# Patient Record
Sex: Female | Born: 1960 | Race: White | Hispanic: No | Marital: Single | Smoking: Former smoker
Health system: Southern US, Community
[De-identification: ages and names within clinical notes are randomized; demographics above are authoritative.]

## PROBLEM LIST (undated history)

## (undated) DIAGNOSIS — Z789 Other specified health status: Secondary | ICD-10-CM

## (undated) HISTORY — PX: CARDIAC CATHETERIZATION: SHX172

## (undated) HISTORY — PX: APPENDECTOMY: SHX54

## (undated) HISTORY — PX: LEFT OOPHORECTOMY: SHX1961

## (undated) HISTORY — PX: MOUTH SURGERY: SHX715

---

## 2011-07-30 ENCOUNTER — Other Ambulatory Visit: Payer: Self-pay | Admitting: Obstetrics and Gynecology

## 2011-07-30 DIAGNOSIS — Z139 Encounter for screening, unspecified: Secondary | ICD-10-CM

## 2011-08-09 ENCOUNTER — Ambulatory Visit (HOSPITAL_COMMUNITY)
Admission: RE | Admit: 2011-08-09 | Discharge: 2011-08-09 | Disposition: A | Discharge status: Home / Self Care | Payer: 59 | Source: Ambulatory Visit | Attending: Obstetrics and Gynecology | Admitting: Obstetrics and Gynecology

## 2011-08-09 ENCOUNTER — Other Ambulatory Visit (HOSPITAL_COMMUNITY)
Admission: RE | Admit: 2011-08-09 | Discharge: 2011-08-09 | Disposition: A | Discharge status: Home / Self Care | Payer: 59 | Source: Ambulatory Visit | Attending: Obstetrics & Gynecology | Admitting: Obstetrics & Gynecology

## 2011-08-09 ENCOUNTER — Other Ambulatory Visit: Payer: Self-pay | Admitting: Obstetrics & Gynecology

## 2011-08-09 DIAGNOSIS — Z139 Encounter for screening, unspecified: Secondary | ICD-10-CM

## 2011-08-09 DIAGNOSIS — Z1231 Encounter for screening mammogram for malignant neoplasm of breast: Secondary | ICD-10-CM | POA: Insufficient documentation

## 2011-08-09 DIAGNOSIS — Z01419 Encounter for gynecological examination (general) (routine) without abnormal findings: Secondary | ICD-10-CM | POA: Insufficient documentation

## 2011-09-20 ENCOUNTER — Telehealth: Payer: Self-pay

## 2011-09-20 ENCOUNTER — Other Ambulatory Visit: Payer: Self-pay

## 2011-09-20 DIAGNOSIS — Z139 Encounter for screening, unspecified: Secondary | ICD-10-CM

## 2011-09-20 NOTE — Telephone Encounter (Signed)
PREPOPIK-DRINK WATER TO KEEP URINE LIGHT YELLOW.  PT SHOULD DROP OFF RX 3 DAYS PRIOR TO PROCEDURE.  

## 2011-09-20 NOTE — Telephone Encounter (Signed)
Gastroenterology Pre-Procedure Form   Pt was triaged by Ginger  Request Date: 09/16/2011      Requesting Physician: Dr. Turner Daniels     PATIENT INFORMATION:  Alison Jordan is a 51 y.o., female (DOB=[birth year]).  PROCEDURE: Procedure(s) requested: colonoscopy Procedure Reason: screening for colon cancer  PATIENT REVIEW QUESTIONS: The patient reports the following:   1. Diabetes Melitis: no 2. Joint replacements in the past 12 months: no 3. Major health problems in the past 3 months: no 4. Has an artificial valve or MVP:no 5. Has been advised in past to take antibiotics in advance of a procedure like teeth cleaning: no}    MEDICATIONS & ALLERGIES:    Patient reports the following regarding taking any blood thinners:   Plavix? no Aspirin?no Coumadin?  no  Patient confirms/reports the following medications:  Current Outpatient Prescriptions  Medication Sig Dispense Refill  . Multiple Vitamin (MULTIVITAMIN) tablet Take 1 tablet by mouth daily.        Patient confirms/reports the following allergies:  No Known Allergies  Patient is appropriate to schedule for requested procedure(s): yes  AUTHORIZATION INFORMATION Primary Insurance:   ID #:  Group #:  Pre-Cert / Auth required: Pre-Cert / Auth #:   Secondary Insurance:   ID #:   Group #: Pre-Cert / Auth required Pre-Cert / Auth #:   No orders of the defined types were placed in this encounter.    SCHEDULE INFORMATION: Procedure has been scheduled as follows:  Date: 10/21/2011     Time: 8:30 AM  Location: [facility] Short Stay  This Gastroenterology Pre-Precedure Form is being routed to the following provider(s) for review: Jonette Eva, MD

## 2011-09-23 MED ORDER — SOD PICOSULFATE-MAG OX-CIT ACD 10-3.5-12 MG-GM-GM PO PACK: 1.0000 | PACK | Freq: Once | ORAL | Status: DC

## 2011-09-23 NOTE — Telephone Encounter (Signed)
Pt is not able to do on 10/21/2011. She is rescheduled for 10/11/2011 @ 2:30 PM. LMOM for Kim.   Rx and instructions mailed to pt.

## 2011-09-27 ENCOUNTER — Encounter (HOSPITAL_COMMUNITY): Payer: Self-pay | Admitting: Pharmacy Technician

## 2011-10-07 ENCOUNTER — Telehealth: Payer: Self-pay

## 2011-10-07 NOTE — Telephone Encounter (Signed)
Pt called to confirm her appt for 10/11/2011 for colonoscopy. Appt at 2:30 and she will need to arrive at [facility] @ 1:30 PM.

## 2011-10-11 ENCOUNTER — Encounter (HOSPITAL_COMMUNITY): Payer: Self-pay | Admitting: *Deleted

## 2011-10-11 ENCOUNTER — Ambulatory Visit (HOSPITAL_COMMUNITY)
Admission: RE | Admit: 2011-10-11 | Discharge: 2011-10-11 | Disposition: A | Discharge status: Home / Self Care | Payer: 59 | Source: Ambulatory Visit | Attending: Gastroenterology | Admitting: Gastroenterology

## 2011-10-11 ENCOUNTER — Encounter (HOSPITAL_COMMUNITY)
Admission: RE | Disposition: A | Discharge status: Home / Self Care | Payer: Self-pay | Source: Ambulatory Visit | Attending: Gastroenterology

## 2011-10-11 DIAGNOSIS — K633 Ulcer of intestine: Secondary | ICD-10-CM

## 2011-10-11 DIAGNOSIS — Z1211 Encounter for screening for malignant neoplasm of colon: Secondary | ICD-10-CM

## 2011-10-11 DIAGNOSIS — D129 Benign neoplasm of anus and anal canal: Secondary | ICD-10-CM | POA: Insufficient documentation

## 2011-10-11 DIAGNOSIS — K621 Rectal polyp: Secondary | ICD-10-CM

## 2011-10-11 DIAGNOSIS — Z139 Encounter for screening, unspecified: Secondary | ICD-10-CM

## 2011-10-11 DIAGNOSIS — K648 Other hemorrhoids: Secondary | ICD-10-CM | POA: Insufficient documentation

## 2011-10-11 DIAGNOSIS — D126 Benign neoplasm of colon, unspecified: Secondary | ICD-10-CM

## 2011-10-11 DIAGNOSIS — K620 Anal polyp: Secondary | ICD-10-CM

## 2011-10-11 DIAGNOSIS — K6389 Other specified diseases of intestine: Secondary | ICD-10-CM | POA: Insufficient documentation

## 2011-10-11 DIAGNOSIS — D128 Benign neoplasm of rectum: Secondary | ICD-10-CM | POA: Insufficient documentation

## 2011-10-11 HISTORY — DX: Other specified health status: Z78.9

## 2011-10-11 HISTORY — PX: COLONOSCOPY: SHX5424

## 2011-10-11 SURGERY — COLONOSCOPY
Anesthesia: Moderate Sedation

## 2011-10-11 MED ORDER — MIDAZOLAM HCL 5 MG/5ML IJ SOLN
INTRAMUSCULAR | Status: DC | PRN
  Administered 2011-10-11 (×2): 2 mg via INTRAVENOUS

## 2011-10-11 MED ORDER — MIDAZOLAM HCL 5 MG/5ML IJ SOLN
INTRAMUSCULAR | Status: AC
  Filled 2011-10-11: qty 10

## 2011-10-11 MED ORDER — MEPERIDINE HCL 100 MG/ML IJ SOLN
INTRAMUSCULAR | Status: DC | PRN
  Administered 2011-10-11: 50 mg
  Administered 2011-10-11: 25 mg

## 2011-10-11 MED ORDER — SODIUM CHLORIDE 0.45 % IV SOLN
Freq: Once | INTRAVENOUS | Status: AC
  Administered 2011-10-11: 20 mL/h via INTRAVENOUS

## 2011-10-11 MED ORDER — MEPERIDINE HCL 100 MG/ML IJ SOLN
INTRAMUSCULAR | Status: AC
  Filled 2011-10-11: qty 1
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
## 2011-10-11 NOTE — Op Note (Signed)
[facility] [address] [place], [place]  [address]  COLONOSCOPY PROCEDURE REPORT  PATIENT:  Alison Jordan, Alison Jordan  MR#:  [id number] BIRTHDATE:  03-21-1960, 51 yrs. old  GENDER:  female  ENDOSCOPIST:  Jonette Eva, MD REF. BY:  Duane Lope, M.D. ASSISTANT:  PROCEDURE DATE:  10/11/2011 PROCEDURE:  IEOColonoscopy with biopsy  INDICATIONS:  Screening  MEDICATIONS:   Demerol 75 mg IV, Versed 4 mg IV  DESCRIPTION OF PROCEDURE:    Physical exam was performed. Informed consent was obtained from the patient after explaining the benefits, risks, and alternatives to procedure.  The patient was connected to monitor and placed in left lateral position. Continuous oxygen was provided by nasal cannula and IV medicine administered through an indwelling cannula.  After administration of sedation and rectal exam, the patient's rectum was intubated and the EC-[id number] ([id number]) colonoscope was advanced under direct visualization to the ILEUM.  The scope was removed slowly by carefully examining the color, texture, anatomy, and integrity mucosa on the way out.  The patient was recovered in endoscopy and discharged home in satisfactory condition. <<PROCEDUREIMAGES>>  FINDINGS:  ONE 1 MM ulcer was found in the ileum & BIOPSIED VIA COLD FORCEPS.  There wAS ONE 4 MM SESSILE polyp identified and removed. in the proximal transverse colon.  There were FOUR 2-3 MM HYPERPLASTIC APPEARING SESSILE polyps identified and removed. in the sigmoid colon.  There were TWO 2-3 MM  HYPERPLASTIC APPEARING SESSILE polyps identified and removed. in the rectum.  ALL POLYPS REMOVED VIA COLD FORCEPS. MELANOSIS COLI. SMALL Internal Hemorrhoids were found.  PREP QUALITY:    GOOD CECAL W/D TIME:      23.5 minutes  COMPLICATIONS:    None  ENDOSCOPIC IMPRESSION: 1) Ulcer in the ileum 2) Polyp in the proximal transverse colon 3) Polyps, multiple in the sigmoid colon 4) Polyps, multiple in the rectum 5) Internal  hemorrhoids 6) MELANOSIS COLI  RECOMMENDATIONS: AWAIT BIOPSY HIGH FIBER DIET TCS IN 10 YEARS  REPEAT EXAM:  No  ______________________________ Jonette Eva, MD  CC:  Duane Lope, M.D.  n. eSIGNED:   Raydell Maners at 10/11/2011 03:31 PM  [place], [place], [phone]

## 2011-10-17 ENCOUNTER — Encounter (HOSPITAL_COMMUNITY): Payer: Self-pay | Admitting: Gastroenterology

## 2011-10-21 ENCOUNTER — Telehealth: Payer: Self-pay | Admitting: Gastroenterology

## 2011-10-21 NOTE — Telephone Encounter (Signed)
Please call pt. SHE had simple & SERRATED adenomaSremoved from hER colon. SHE HAD A FEW SMALL ULCERS IN HER SMALL BOWEL THAT COME FROM USING ASA OR NSAIDS.  FOLLOW A High fiber diet. TCS IN 10 YEARS.

## 2011-10-22 NOTE — Telephone Encounter (Signed)
Recall made 

## 2011-10-22 NOTE — Telephone Encounter (Signed)
Pt returned call and was informed.  

## 2011-10-22 NOTE — Telephone Encounter (Signed)
LMOM to call.

## 2016-05-15 ENCOUNTER — Encounter: Payer: Self-pay | Admitting: Emergency Medicine

## 2016-05-15 ENCOUNTER — Emergency Department
Admission: EM | Admit: 2016-05-15 | Discharge: 2016-05-15 | Disposition: A | Discharge status: Home / Self Care | Payer: 59 | Attending: Emergency Medicine | Admitting: Emergency Medicine

## 2016-05-15 ENCOUNTER — Emergency Department: Payer: 59

## 2016-05-15 DIAGNOSIS — Z87891 Personal history of nicotine dependence: Secondary | ICD-10-CM | POA: Diagnosis not present

## 2016-05-15 DIAGNOSIS — J209 Acute bronchitis, unspecified: Secondary | ICD-10-CM | POA: Insufficient documentation

## 2016-05-15 DIAGNOSIS — R05 Cough: Secondary | ICD-10-CM | POA: Diagnosis not present

## 2016-05-15 MED ORDER — AZITHROMYCIN 500 MG PO TABS
500.0000 mg | ORAL_TABLET | Freq: Once | ORAL | Status: AC
  Administered 2016-05-15: 500 mg via ORAL
  Filled 2016-05-15: qty 1

## 2016-05-15 MED ORDER — BENZONATATE 100 MG PO CAPS
200.0000 mg | ORAL_CAPSULE | Freq: Once | ORAL | Status: AC
  Administered 2016-05-15: 200 mg via ORAL
  Filled 2016-05-15: qty 2

## 2016-05-15 MED ORDER — ALBUTEROL SULFATE HFA 108 (90 BASE) MCG/ACT IN AERS: 2.0000 | INHALATION_SPRAY | Freq: Four times a day (QID) | RESPIRATORY_TRACT | 2 refills | Status: AC | PRN

## 2016-05-15 MED ORDER — HYDROCOD POLST-CPM POLST ER 10-8 MG/5ML PO SUER: 5.0000 mL | Freq: Two times a day (BID) | ORAL | 0 refills | Status: AC | PRN

## 2016-05-15 MED ORDER — AZITHROMYCIN 250 MG PO TABS: ORAL_TABLET | ORAL | 0 refills | Status: AC

## 2016-05-15 NOTE — ED Triage Notes (Addendum)
Patient ambulatory to triage with steady gait, without difficulty or distress noted; pt reports since Sunday having nonprod cough and fever; taking mucinex DM without relief; pt reports that she is concerned that she may have pneumonia
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
## 2017-04-04 DIAGNOSIS — H5203 Hypermetropia, bilateral: Secondary | ICD-10-CM | POA: Diagnosis not present

## 2017-04-04 DIAGNOSIS — H524 Presbyopia: Secondary | ICD-10-CM | POA: Diagnosis not present

## 2017-09-04 IMAGING — CR DG CHEST 2V
1 series · 2 of 2 positions shown · non-contrast
Comparison: None.

CLINICAL DATA: 55-year-old female with cough

EXAM:
CHEST  2 VIEW

[Series 1: dg chest 2 view · 0.14mm/px · 2 of 2 slices shown]
[im 1/2]
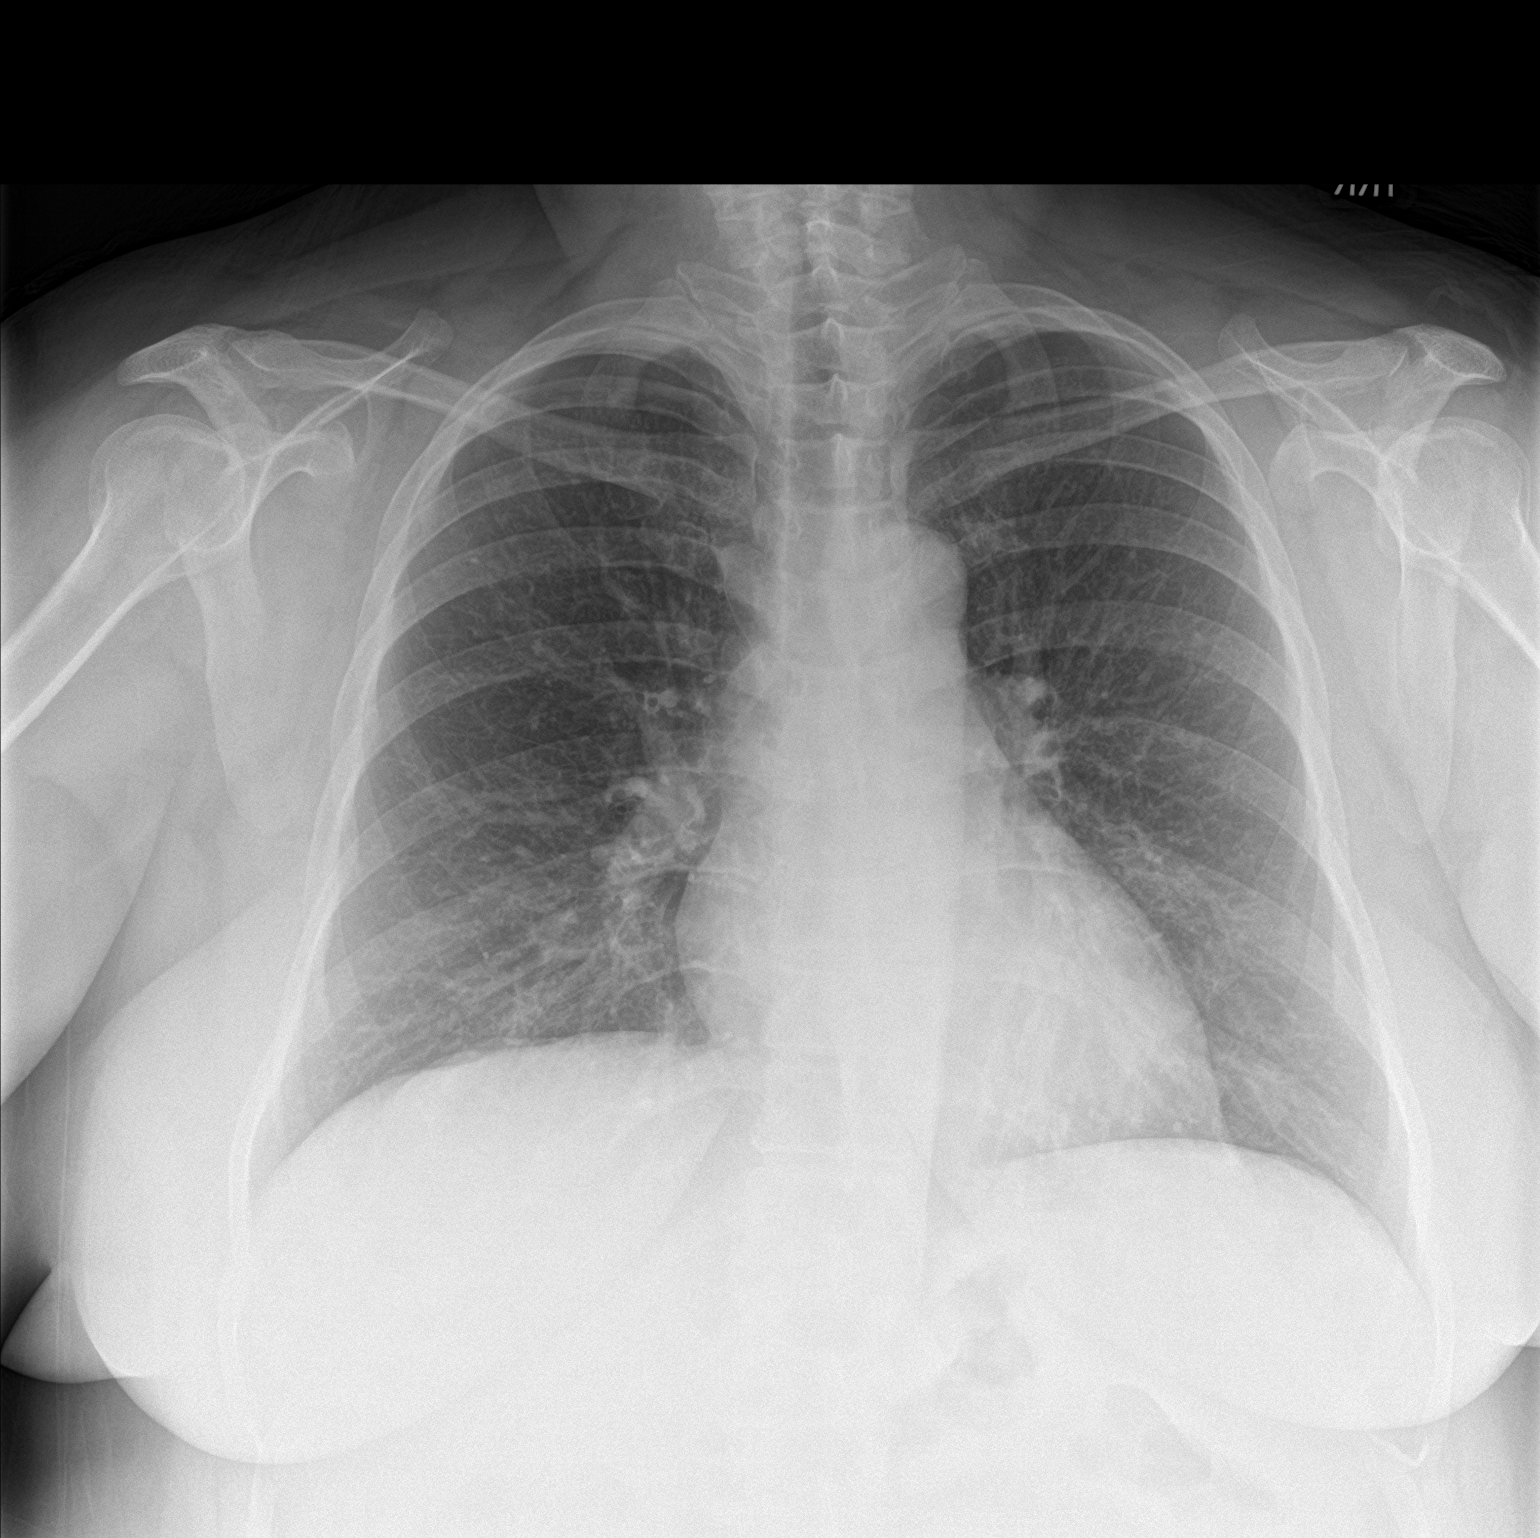
[im 2/2]
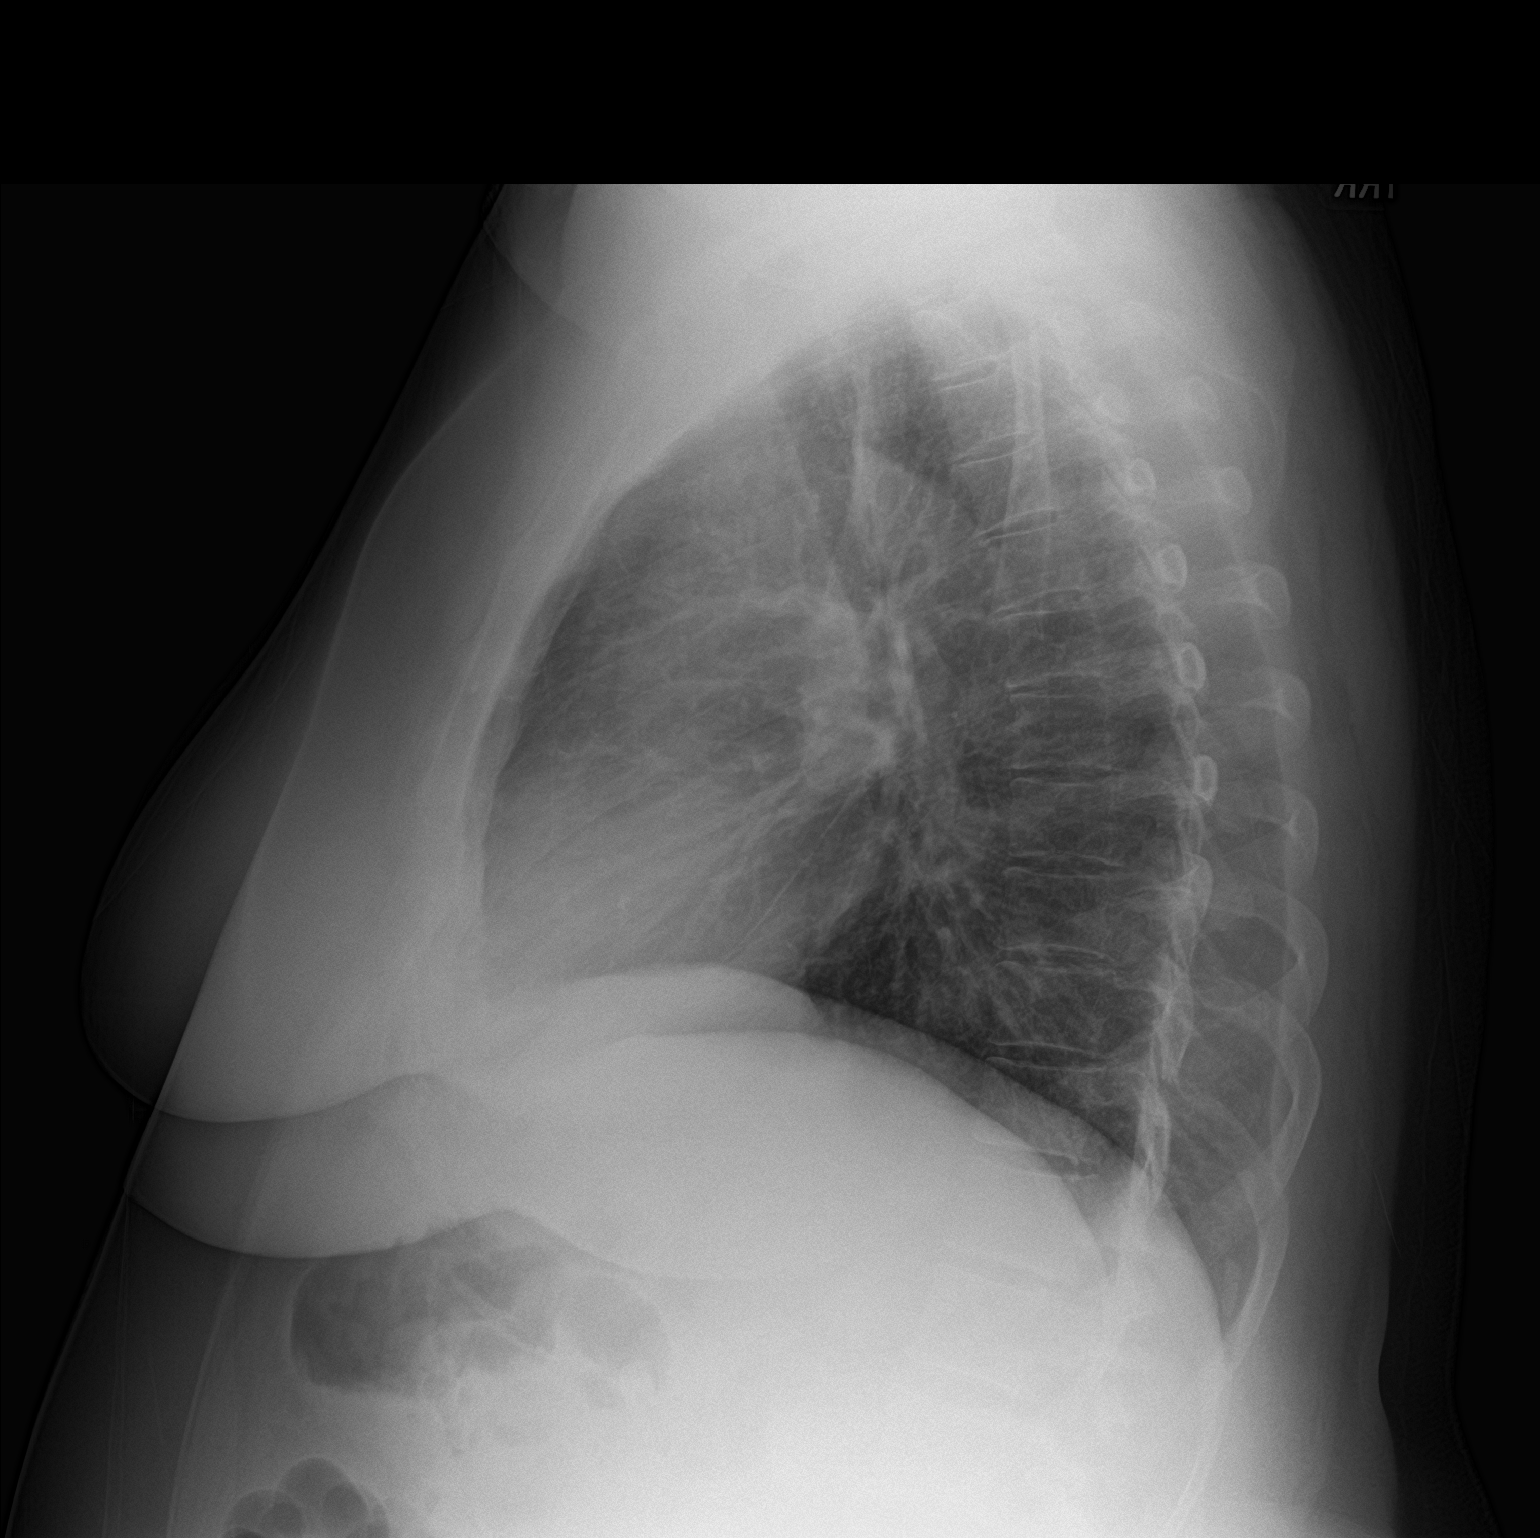

[2 of 2 positions shown; findings below may reference images not displayed]

FINDINGS: The heart size and mediastinal contours are within normal limits.
Both lungs are clear. The visualized skeletal structures are
unremarkable.
IMPRESSION: No active cardiopulmonary disease.

## 2018-04-06 DIAGNOSIS — H5203 Hypermetropia, bilateral: Secondary | ICD-10-CM | POA: Diagnosis not present

## 2018-04-06 DIAGNOSIS — H524 Presbyopia: Secondary | ICD-10-CM | POA: Diagnosis not present

## 2019-09-22 DIAGNOSIS — H52223 Regular astigmatism, bilateral: Secondary | ICD-10-CM | POA: Diagnosis not present

## 2019-09-22 DIAGNOSIS — H5203 Hypermetropia, bilateral: Secondary | ICD-10-CM | POA: Diagnosis not present

## 2019-09-22 DIAGNOSIS — H524 Presbyopia: Secondary | ICD-10-CM | POA: Diagnosis not present

## 2019-11-14 DIAGNOSIS — R030 Elevated blood-pressure reading, without diagnosis of hypertension: Secondary | ICD-10-CM | POA: Diagnosis not present

## 2019-11-14 DIAGNOSIS — R21 Rash and other nonspecific skin eruption: Secondary | ICD-10-CM | POA: Diagnosis not present

## 2021-09-06 ENCOUNTER — Encounter: Payer: Self-pay | Admitting: *Deleted

## 2023-04-22 DIAGNOSIS — H43811 Vitreous degeneration, right eye: Secondary | ICD-10-CM | POA: Diagnosis not present

## 2023-06-04 DIAGNOSIS — H52223 Regular astigmatism, bilateral: Secondary | ICD-10-CM | POA: Diagnosis not present

## 2023-06-04 DIAGNOSIS — H524 Presbyopia: Secondary | ICD-10-CM | POA: Diagnosis not present

## 2023-06-04 DIAGNOSIS — H43811 Vitreous degeneration, right eye: Secondary | ICD-10-CM | POA: Diagnosis not present

## 2023-06-04 DIAGNOSIS — H5203 Hypermetropia, bilateral: Secondary | ICD-10-CM | POA: Diagnosis not present
